# Patient Record
Sex: Female | Born: 2005 | Race: Black or African American | Hispanic: No | Marital: Single
Health system: Southern US, Community
[De-identification: ages and names within clinical notes are randomized; demographics above are authoritative.]

## PROBLEM LIST (undated history)

## (undated) DIAGNOSIS — F32A Depression, unspecified: Secondary | ICD-10-CM

## (undated) DIAGNOSIS — F419 Anxiety disorder, unspecified: Secondary | ICD-10-CM

## (undated) HISTORY — DX: Anxiety disorder, unspecified: F41.9

## (undated) HISTORY — DX: Depression, unspecified: F32.A

---
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
## 2024-07-25 ENCOUNTER — Ambulatory Visit (INDEPENDENT_AMBULATORY_CARE_PROVIDER_SITE_OTHER): Payer: Self-pay | Admitting: Family

## 2024-07-25 ENCOUNTER — Encounter: Payer: Self-pay | Admitting: Family

## 2024-07-25 VITALS — BP 118/77 | HR 71 | Temp 98.6°F | Resp 16 | Ht 61.0 in | Wt 215.8 lb

## 2024-07-25 DIAGNOSIS — Z7689 Persons encountering health services in other specified circumstances: Secondary | ICD-10-CM

## 2024-07-25 DIAGNOSIS — F418 Other specified anxiety disorders: Secondary | ICD-10-CM

## 2024-07-25 DIAGNOSIS — F32A Depression, unspecified: Secondary | ICD-10-CM

## 2024-07-25 NOTE — Progress Notes (Signed)
 Subjective:    Ann Pope - 18 y.o. female MRN [id number]  Date of birth: January 22, 2006  HPI  Ann Pope is to establish care.   Current issues and/or concerns: - States anxiety depression related to school and how she will support herself. States she thought today's anxiety depression questionnaire was related to how she felt in the past. Patient confirms she had thoughts of self-harm over 1 year ago. She denies current thoughts of self-harm, suicidal ideations, homicidal ideations.  ROS per HPI     Health Maintenance:   Health Maintenance Due  Topic Date Due   Hepatitis B Vaccines 19-59 Average Risk (2 of 3 - 3-dose series) 08/16/2006   DTaP/Tdap/Td (2 - Td or Tdap) 02/26/2014   HPV VACCINES (1 - 3-dose series) Never done   Meningococcal B Vaccine (1 of 2 - Standard) Never done   Hepatitis C Screening  Never done   COVID-19 Vaccine (1 - 2024-25 season) Never done   INFLUENZA VACCINE  07/12/2024     Past Medical History: Patient Active Problem List   Diagnosis Date Noted   Irregular menses 12/29/2020   Adjustment disorder with mixed anxiety and depressed mood 12/29/2020      Social History   reports that she has never smoked. She has never been exposed to tobacco smoke. She has never used smokeless tobacco. She reports that she does not drink alcohol and does not use drugs.   Family History  family history is not on file.   Medications: reviewed and updated   Objective:   Physical Exam BP 118/77   Pulse 71   Temp 98.6 F (37 C) (Oral)   Resp 16   Ht 5' 1 (1.549 m)   Wt 215 lb 12.8 oz (97.9 kg)   LMP 07/22/2024 (Approximate)   SpO2 97%   BMI 40.78 kg/m   Physical Exam HENT:     Head: Normocephalic and atraumatic.     Nose: Nose normal.     Mouth/Throat:     Mouth: Mucous membranes are moist.     Pharynx: Oropharynx is clear.  Eyes:     Extraocular Movements: Extraocular movements intact.     Conjunctiva/sclera: Conjunctivae normal.     Pupils:  Pupils are equal, round, and reactive to light.  Cardiovascular:     Rate and Rhythm: Normal rate and regular rhythm.     Pulses: Normal pulses.     Heart sounds: Normal heart sounds.  Pulmonary:     Effort: Pulmonary effort is normal.     Breath sounds: Normal breath sounds.  Musculoskeletal:        General: Normal range of motion.     Cervical back: Normal range of motion and neck supple.  Neurological:     General: No focal deficit present.     Mental Status: She is alert and oriented to person, place, and time.  Psychiatric:        Mood and Affect: Mood normal.        Behavior: Behavior normal.      Assessment & Plan:  1. Encounter to establish care (Primary) - Patient presents today to establish care. During the interim follow-up with primary provider as scheduled.  - Return for annual physical examination, labs, and health maintenance. Arrive fasting meaning having no food for at least 8 hours prior to appointment. You may have only water or black coffee. Please take scheduled medications as normal.  2. Anxiety and depression - Patient denies thoughts of self-harm,  suicidal ideations, homicidal ideations. - Patient declined pharmacological therapy.  - Patient declined referral to Psychiatry.  - Follow-up with primary provider as scheduled.    Patient was given clear instructions to go to Emergency Department or return to medical center if symptoms don't improve, worsen, or new problems develop.The patient verbalized understanding.  I discussed the assessment and treatment plan with the patient. The patient was provided an opportunity to ask questions and all were answered. The patient agreed with the plan and demonstrated an understanding of the instructions.   The patient was advised to call back or seek an in-person evaluation if the symptoms worsen or if the condition fails to improve as anticipated.    Greig Drones, NP 07/25/2024, 11:05 AM Primary Care at [facility]

## 2024-07-25 NOTE — Progress Notes (Signed)
 Patient scored a 14 on PHQ-9, patient check she had thoughts she would be better off dead

## 2024-07-30 ENCOUNTER — Telehealth: Payer: Self-pay | Admitting: Family

## 2024-07-30 NOTE — Telephone Encounter (Signed)
 Pt asking for sleep apnea studies. Please advise

## 2024-07-31 ENCOUNTER — Other Ambulatory Visit: Payer: Self-pay | Admitting: Family

## 2024-07-31 DIAGNOSIS — R0683 Snoring: Secondary | ICD-10-CM

## 2024-07-31 NOTE — Telephone Encounter (Signed)
 Complete

## 2024-11-29 ENCOUNTER — Ambulatory Visit: Payer: Self-pay | Admitting: Family

## 2024-11-29 ENCOUNTER — Encounter: Payer: Self-pay | Admitting: Family

## 2024-11-29 VITALS — BP 119/81 | HR 86 | Temp 99.2°F | Resp 18 | Ht 61.01 in | Wt 212.0 lb

## 2024-11-29 DIAGNOSIS — F32A Depression, unspecified: Secondary | ICD-10-CM | POA: Diagnosis not present

## 2024-11-29 DIAGNOSIS — Z1329 Encounter for screening for other suspected endocrine disorder: Secondary | ICD-10-CM

## 2024-11-29 DIAGNOSIS — F419 Anxiety disorder, unspecified: Secondary | ICD-10-CM | POA: Diagnosis not present

## 2024-11-29 DIAGNOSIS — Z130 Encounter for screening for diseases of the blood and blood-forming organs and certain disorders involving the immune mechanism: Secondary | ICD-10-CM

## 2024-11-29 DIAGNOSIS — Z0000 Encounter for general adult medical examination without abnormal findings: Secondary | ICD-10-CM

## 2024-11-29 DIAGNOSIS — Z1159 Encounter for screening for other viral diseases: Secondary | ICD-10-CM | POA: Diagnosis not present

## 2024-11-29 DIAGNOSIS — Z13228 Encounter for screening for other metabolic disorders: Secondary | ICD-10-CM

## 2024-11-29 DIAGNOSIS — Z131 Encounter for screening for diabetes mellitus: Secondary | ICD-10-CM | POA: Diagnosis not present

## 2024-11-29 DIAGNOSIS — Z13220 Encounter for screening for lipoid disorders: Secondary | ICD-10-CM | POA: Diagnosis not present

## 2024-11-29 NOTE — Progress Notes (Signed)
 "   Patient ID: Ann Pope, female    DOB: 2005/12/16  MRN: [id number]  CC: Annual Exam  Subjective: Ann Pope is a 18 y.o. female who presents for annual exam.   Her concerns today include:  - Anxiety depression. She denies thoughts of self-harm, suicidal ideations, homicidal ideations.  Patient Active Problem List   Diagnosis Date Noted   Irregular menses 12/29/2020   Adjustment disorder with mixed anxiety and depressed mood 12/29/2020     Medications Ordered Prior to Encounter[1]  Allergies[2]  Social History   Socioeconomic History   Marital status: Single    Spouse name: Not on file   Number of children: Not on file   Years of education: Not on file   Highest education level: Not on file  Occupational History   Not on file  Tobacco Use   Smoking status: Never    Passive exposure: Never   Smokeless tobacco: Never  Vaping Use   Vaping status: Never Used  Substance and Sexual Activity   Alcohol use: Never   Drug use: Never   Sexual activity: Not Currently  Other Topics Concern   Not on file  Social History Narrative   Not on file   Social Drivers of Health   Tobacco Use: Low Risk (11/29/2024)   Patient History    Smoking Tobacco Use: Never    Smokeless Tobacco Use: Never    Passive Exposure: Never  Financial Resource Strain: Low Risk (07/25/2024)   Overall Financial Resource Strain (CARDIA)    Difficulty of Paying Living Expenses: Not hard at all  Food Insecurity: No Food Insecurity (07/25/2024)   Epic    Worried About Programme Researcher, Broadcasting/film/video in the Last Year: Never true    Ran Out of Food in the Last Year: Never true  Transportation Needs: No Transportation Needs (07/25/2024)   Epic    Lack of Transportation (Medical): No    Lack of Transportation (Non-Medical): No  Physical Activity: Inactive (07/25/2024)   Exercise Vital Sign    Days of Exercise per Week: 0 days    Minutes of Exercise per Session: 0 min  Stress: No Stress Concern Present  (07/25/2024)   [employer] of Occupational Health - Occupational Stress Questionnaire    Feeling of Stress: Not at all  Social Connections: Unknown (07/25/2024)   Social Connection and Isolation Panel    Frequency of Communication with Friends and Family: Patient unable to answer    Frequency of Social Gatherings with Friends and Family: Patient unable to answer    Attends Religious Services: Never    Database Administrator or Organizations: Yes    Attends Banker Meetings: Never    Marital Status: Never married  Intimate Partner Violence: Not At Risk (07/25/2024)   Epic    Fear of Current or Ex-Partner: No    Emotionally Abused: No    Physically Abused: No    Sexually Abused: No  Depression (PHQ2-9): Medium Risk (11/29/2024)   Depression (PHQ2-9)    PHQ-2 Score: 8  Alcohol Screen: Low Risk (07/25/2024)   Alcohol Screen    Last Alcohol Screening Score (AUDIT): 0  Housing: Unknown (07/25/2024)   Epic    Unable to Pay for Housing in the Last Year: No    Number of Times Moved in the Last Year: Not on file    Homeless in the Last Year: No  Utilities: Not At Risk (07/25/2024)   Epic    Threatened with loss of  utilities: No  Health Literacy: Inadequate Health Literacy (07/25/2024)   B1300 Health Literacy    Frequency of need for help with medical instructions: Often    History reviewed. No pertinent family history.  History reviewed. No pertinent surgical history.  ROS: Review of Systems Negative except as stated above  PHYSICAL EXAM: BP 119/81   Pulse 86   Temp 99.2 F (37.3 C) (Oral)   Resp 18   Ht 5' 1.01 (1.55 m)   Wt 212 lb (96.2 kg)   LMP  (LMP Unknown)   SpO2 98%   BMI 40.04 kg/m   Physical Exam HENT:     Head: Normocephalic and atraumatic.     Right Ear: Tympanic membrane, ear canal and external ear normal.     Left Ear: Tympanic membrane, ear canal and external ear normal.     Nose: Nose normal.     Mouth/Throat:     Mouth: Mucous  membranes are moist.     Pharynx: Oropharynx is clear.  Eyes:     Extraocular Movements: Extraocular movements intact.     Conjunctiva/sclera: Conjunctivae normal.     Pupils: Pupils are equal, round, and reactive to light.  Neck:     Thyroid: No thyroid mass, thyromegaly or thyroid tenderness.  Cardiovascular:     Rate and Rhythm: Normal rate and regular rhythm.     Pulses: Normal pulses.     Heart sounds: Normal heart sounds.  Pulmonary:     Effort: Pulmonary effort is normal.     Breath sounds: Normal breath sounds.  Chest:     Comments: Patient declined.  Abdominal:     General: Bowel sounds are normal.     Palpations: Abdomen is soft.  Genitourinary:    Comments: Patient declined. Musculoskeletal:        General: Normal range of motion.     Right shoulder: Normal.     Left shoulder: Normal.     Right upper arm: Normal.     Left upper arm: Normal.     Right elbow: Normal.     Left elbow: Normal.     Right forearm: Normal.     Left forearm: Normal.     Right wrist: Normal.     Left wrist: Normal.     Right hand: Normal.     Left hand: Normal.     Cervical back: Normal, normal range of motion and neck supple.     Thoracic back: Normal.     Lumbar back: Normal.     Right hip: Normal.     Left hip: Normal.     Right upper leg: Normal.     Left upper leg: Normal.     Right knee: Normal.     Left knee: Normal.     Right lower leg: Normal.     Left lower leg: Normal.     Right ankle: Normal.     Left ankle: Normal.     Right foot: Normal.     Left foot: Normal.  Skin:    General: Skin is warm and dry.     Capillary Refill: Capillary refill takes less than 2 seconds.  Neurological:     General: No focal deficit present.     Mental Status: She is alert and oriented to person, place, and time.  Psychiatric:        Mood and Affect: Mood normal.        Behavior: Behavior normal.    ASSESSMENT AND PLAN:  1. Annual physical exam (Primary) - Counseled on 150  minutes of exercise per week, healthy eating (including decreased daily intake of saturated fats, cholesterol, added sugars, sodium), STI prevention, routine healthcare maintenance.  2. Screening for metabolic disorder - Routine screening.  - CMP14+EGFR  3. Screening for deficiency anemia - Routine screening.  - CBC  4. Diabetes mellitus screening - Routine screening.  - Hemoglobin A1c  5. Screening cholesterol level - Routine screening.  - Lipid panel  6. Thyroid disorder screen - Routine screening.  - TSH  7. Need for hepatitis C screening test - Routine screening.  - Hepatitis C Antibody  8. Anxiety and depression - Patient denies thoughts of self-harm, suicidal ideations, homicidal ideations. - Patient declined pharmacological therapy.  - Patient declined referral to Psychiatry.  - Follow-up with primary provider as scheduled.   Patient was given the opportunity to ask questions.  Patient verbalized understanding of the plan and was able to repeat key elements of the plan. Patient was given clear instructions to go to Emergency Department or return to medical center if symptoms don't improve, worsen, or new problems develop.The patient verbalized understanding.   Orders Placed This Encounter  Procedures   Hepatitis C Antibody   CBC   Lipid panel   CMP14+EGFR   Hemoglobin A1c   TSH    Return in about 1 year (around 11/29/2025) for Physical per patient preference.  Greig JINNY Chute, NP      [1]  Current Outpatient Medications on File Prior to Visit  Medication Sig Dispense Refill   ferrous fumarate  (HEMOCYTE - 106 MG FE) 325 (106 Fe) MG TABS tablet Take 1 tablet (106 mg of iron total) by mouth daily. (Patient not taking: Reported on 07/25/2024) 90 tablet 1   Vitamin D , Ergocalciferol , (DRISDOL ) 1.25 MG (50000 UNIT) CAPS capsule TAKE 1 CAPSULE (50,000 UNITS TOTAL) BY MOUTH EVERY 7 (SEVEN) DAYS (Patient not taking: Reported on 07/25/2024) 8 capsule 0   No current  facility-administered medications on file prior to visit.  [2] No Known Allergies  "

## 2024-11-29 NOTE — Progress Notes (Signed)
 Patient scored a 8 on the PHQ-9

## 2024-11-30 LAB — HEMOGLOBIN A1C
Est. average glucose Bld gHb Est-mCnc: 108 mg/dL
Hgb A1c MFr Bld: 5.4 % (ref 4.8–5.6)

## 2024-11-30 LAB — CMP14+EGFR
ALT: 12 IU/L (ref 0–32)
AST: 18 IU/L (ref 0–40)
Albumin: 4.4 g/dL (ref 4.0–5.0)
Alkaline Phosphatase: 55 IU/L (ref 42–106)
BUN/Creatinine Ratio: 10 (ref 9–23)
BUN: 9 mg/dL (ref 6–20)
Bilirubin Total: 0.6 mg/dL (ref 0.0–1.2)
CO2: 20 mmol/L (ref 20–29)
Calcium: 9.7 mg/dL (ref 8.7–10.2)
Chloride: 103 mmol/L (ref 96–106)
Creatinine, Ser: 0.91 mg/dL (ref 0.57–1.00)
Globulin, Total: 2.9 g/dL (ref 1.5–4.5)
Glucose: 66 mg/dL — ABNORMAL LOW (ref 70–99)
Potassium: 4.3 mmol/L (ref 3.5–5.2)
Sodium: 140 mmol/L (ref 134–144)
Total Protein: 7.3 g/dL (ref 6.0–8.5)
eGFR: 93 mL/min/1.73

## 2024-11-30 LAB — LIPID PANEL
Chol/HDL Ratio: 4.3 ratio (ref 0.0–4.4)
Cholesterol, Total: 199 mg/dL — ABNORMAL HIGH (ref 100–169)
HDL: 46 mg/dL
LDL Chol Calc (NIH): 140 mg/dL — ABNORMAL HIGH (ref 0–109)
Triglycerides: 72 mg/dL (ref 0–89)
VLDL Cholesterol Cal: 13 mg/dL (ref 5–40)

## 2024-11-30 LAB — CBC
Hematocrit: 41.7 % (ref 34.0–46.6)
Hemoglobin: 12.3 g/dL (ref 11.1–15.9)
MCH: 22.8 pg — ABNORMAL LOW (ref 26.6–33.0)
MCHC: 29.5 g/dL — ABNORMAL LOW (ref 31.5–35.7)
MCV: 77 fL — ABNORMAL LOW (ref 79–97)
Platelets: 315 x10E3/uL (ref 150–450)
RBC: 5.4 x10E6/uL — ABNORMAL HIGH (ref 3.77–5.28)
RDW: 15 % (ref 11.7–15.4)
WBC: 6.1 x10E3/uL (ref 3.4–10.8)

## 2024-11-30 LAB — TSH: TSH: 2.93 u[IU]/mL (ref 0.450–4.500)

## 2024-11-30 LAB — HEPATITIS C ANTIBODY: Hep C Virus Ab: NONREACTIVE

## 2024-12-02 ENCOUNTER — Ambulatory Visit: Payer: Self-pay | Admitting: Family

## 2024-12-02 DIAGNOSIS — Z130 Encounter for screening for diseases of the blood and blood-forming organs and certain disorders involving the immune mechanism: Secondary | ICD-10-CM

## 2024-12-02 DIAGNOSIS — Z13220 Encounter for screening for lipoid disorders: Secondary | ICD-10-CM

## 2025-12-02 ENCOUNTER — Encounter: Payer: Self-pay | Admitting: Family
